# Patient Record
Sex: Female | Born: 1995 | Race: Black or African American | Hispanic: No | Marital: Single | Smoking: Never smoker
Health system: Southern US, Community
[De-identification: ages and names within clinical notes are randomized; demographics above are authoritative.]

## PROBLEM LIST (undated history)

## (undated) ENCOUNTER — Inpatient Hospital Stay (HOSPITAL_COMMUNITY): Payer: Self-pay

## (undated) DIAGNOSIS — R569 Unspecified convulsions: Secondary | ICD-10-CM

## (undated) DIAGNOSIS — N390 Urinary tract infection, site not specified: Secondary | ICD-10-CM

---
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
## 2014-10-07 ENCOUNTER — Encounter (HOSPITAL_COMMUNITY): Payer: Self-pay

## 2014-10-07 ENCOUNTER — Inpatient Hospital Stay (HOSPITAL_COMMUNITY): Payer: Self-pay

## 2014-10-07 ENCOUNTER — Inpatient Hospital Stay (HOSPITAL_COMMUNITY)
Admission: AD | Admit: 2014-10-07 | Discharge: 2014-10-07 | Disposition: A | Discharge status: Home / Self Care | Payer: Self-pay | Source: Ambulatory Visit | Attending: Obstetrics & Gynecology | Admitting: Obstetrics & Gynecology

## 2014-10-07 DIAGNOSIS — Z3A01 Less than 8 weeks gestation of pregnancy: Secondary | ICD-10-CM | POA: Insufficient documentation

## 2014-10-07 DIAGNOSIS — O23591 Infection of other part of genital tract in pregnancy, first trimester: Secondary | ICD-10-CM | POA: Insufficient documentation

## 2014-10-07 DIAGNOSIS — O26899 Other specified pregnancy related conditions, unspecified trimester: Secondary | ICD-10-CM

## 2014-10-07 DIAGNOSIS — R109 Unspecified abdominal pain: Secondary | ICD-10-CM

## 2014-10-07 DIAGNOSIS — O2341 Unspecified infection of urinary tract in pregnancy, first trimester: Secondary | ICD-10-CM | POA: Insufficient documentation

## 2014-10-07 DIAGNOSIS — O133 Gestational [pregnancy-induced] hypertension without significant proteinuria, third trimester: Secondary | ICD-10-CM

## 2014-10-07 DIAGNOSIS — B9689 Other specified bacterial agents as the cause of diseases classified elsewhere: Secondary | ICD-10-CM | POA: Insufficient documentation

## 2014-10-07 DIAGNOSIS — N760 Acute vaginitis: Secondary | ICD-10-CM | POA: Insufficient documentation

## 2014-10-07 HISTORY — DX: Unspecified convulsions: R56.9

## 2014-10-07 HISTORY — DX: Urinary tract infection, site not specified: N39.0

## 2014-10-07 LAB — URINALYSIS, ROUTINE W REFLEX MICROSCOPIC
Bilirubin Urine: NEGATIVE
Glucose, UA: NEGATIVE mg/dL
Ketones, ur: NEGATIVE mg/dL
LEUKOCYTES UA: NEGATIVE
Nitrite: POSITIVE — AB
Protein, ur: NEGATIVE mg/dL
SPECIFIC GRAVITY, URINE: 1.02 (ref 1.005–1.030)
UROBILINOGEN UA: 0.2 mg/dL (ref 0.0–1.0)
pH: 5.5 (ref 5.0–8.0)

## 2014-10-07 LAB — URINE MICROSCOPIC-ADD ON

## 2014-10-07 LAB — CBC
HEMATOCRIT: 34 % — AB (ref 36.0–46.0)
Hemoglobin: 11.8 g/dL — ABNORMAL LOW (ref 12.0–15.0)
MCH: 30.7 pg (ref 26.0–34.0)
MCHC: 34.7 g/dL (ref 30.0–36.0)
MCV: 88.5 fL (ref 78.0–100.0)
PLATELETS: 223 10*3/uL (ref 150–400)
RBC: 3.84 MIL/uL — ABNORMAL LOW (ref 3.87–5.11)
RDW: 11.7 % (ref 11.5–15.5)
WBC: 10.6 10*3/uL — AB (ref 4.0–10.5)

## 2014-10-07 LAB — WET PREP, GENITAL
Trich, Wet Prep: NONE SEEN
Yeast Wet Prep HPF POC: NONE SEEN

## 2014-10-07 LAB — HIV ANTIBODY (ROUTINE TESTING W REFLEX): HIV: NONREACTIVE

## 2014-10-07 LAB — HCG, QUANTITATIVE, PREGNANCY: hCG, Beta Chain, Quant, S: 19880 m[IU]/mL — ABNORMAL HIGH (ref <5)

## 2014-10-07 LAB — POCT PREGNANCY, URINE: PREG TEST UR: POSITIVE — AB

## 2014-10-07 MED ORDER — METRONIDAZOLE 500 MG PO TABS: 500.0000 mg | ORAL_TABLET | Freq: Two times a day (BID) | ORAL | Status: AC

## 2014-10-07 MED ORDER — CEPHALEXIN 500 MG PO CAPS: 500.0000 mg | ORAL_CAPSULE | Freq: Four times a day (QID) | ORAL | Status: AC

## 2014-10-07 MED ORDER — PRENATAL PLUS 27-1 MG PO TABS: 1.0000 | ORAL_TABLET | Freq: Every day | ORAL | Status: AC

## 2014-10-07 NOTE — Discharge Instructions (Signed)
Risks of Alcohol Use in Pregnancy Alcohol is the biggest cause of mental retardation in babies. Women who are pregnant or who may become pregnant should not drink alcohol. This will lessen the chance of giving birth to a baby with any of the harmful effects of FASD (Fetal Alcohol Spectrum Disorders). FASD is the full spectrum of birth defects caused by drinking while pregnant. The spectrum may include mild changes, such as a slight learning disability. Or it could be full FAS (Fetal Alcohol Syndrome). FAS is a permanent condition that can include: severe learning disabilities, decreased growth (growth deficiencies), abnormal facial features, brain (central nervous system) disorders, and behavior problems. It is unsafe to drink any amount of alcohol when pregnant. When the mother drinks alcohol, so does the baby. The amount of alcohol in the mother's blood shows up as the same amount found in the baby's blood. Alcohol is digested by the mature liver in the mother, but the liver is not mature in the baby. As a result, alcohol affects the baby much more than it affects the mother. The [employer]st[employer]ians and Gynecologists recommend the safest way to prevent problems to the baby is to not drink any amount of alcohol when pregnant. One of the most preventable causes of a baby developing FASD, especially FAS, is drinking alcohol. FAS is a lifelong physically and mentally disabling condition. The safest thing to do is not drink alcohol when pregnant. RISKS Alcohol consumed during pregnancy increases the risk of alcohol related birth defects. These include: Central nervous system disorders. Decreased growth. Delayed intellectual development. Behavioral disorders. Abnormal facial features. Mental retardation. Sleep and sucking disorders. Children with FAS are at risk of: Having psychiatric problems. Criminal behavior. Unemployment. Not finishing their education. No amount of alcohol can be  considered safe during pregnancy. Alcohol can damage a fetus at any stage of pregnancy. Damage can occur in the early weeks of pregnancy. This is even before a woman knows that she is pregnant. The learning (cognitive) and behavior problems due to prenatal alcohol exposure are lifelong. Alcohol-related birth defects are completely preventable. There is an increased risk of miscarriage and fetal death. Fetal growth retardation. Increase in accidents at home, away, and driving. Drinking alcohol can easily lead to abuse of drugs and smoking. SYMPTOMS  Drinking alcohol slows down the function of your body and brain. It negatively affects your: Walking (unstable). Hearing. Vision. Daily activities. Driving. Mind and memory. Talking (slurred speech). It contributes to bad diet and nutrition, which are important for the growth and development of the baby. In time, it will also cause problems with your health, your relationship with your family, friends, and work. DIAGNOSIS  Do not wait for symptoms to develop to make the diagnosis. At the first prenatal office visit, the caregiver and patient should discuss alcohol drinking when pregnant. If the mother is drinking alcohol, both the caregiver and patient should make a serious attempt to stop the drinking completely. TREATMENT  The best treatment is to not drink or stop drinking. If someone needs help to stop drinking, she should contact the local Alcoholics Anonymous chapter. Or contact a treatment center. The Substance Abuse and Mental Health Services [facility](SAMHSA) can help people find local centers in their area. Intervention by the spouse, family, and friends. Counseling by pastor, psychologist, or psychiatrist. Hospitalization may be necessary. If the spouse drinks alcohol, he should stop. HOME CARE INSTRUCTIONS  A woman should not drink alcohol while she is pregnant. A pregnant woman who has  already consumed alcohol during her pregnancy  should stop immediately. This will reduce further risk to the baby. A woman who is considering becoming pregnant should not drink alcohol. Women of child-bearing age should talk to their caregivers about this issue. Then take steps to reduce the chance of drinking alcohol while pregnant. The father plays an important role in helping the mother abstain from drinking alcohol. He can encourage her to abstain and should abstain himself. Avoiding social events where alcohol is served will help, too. If you think your baby has FASD or FAS, call your caregiver. FOR MORE INFORMATION: The [employer] on Alcohol Abuse and Alcoholism, NIH: BasicStudents.dk The Substance Abuse and Mental Health Services Administration: www.fascenter.RockToxic.pl Document Released: 08/07/2007 Document Revised: 01/02/2012 Document Reviewed: 12/12/2013 [facility] Patient Information 2015 [place], [place]. This information is not intended to replace advice given to you by your health care provider. Make sure you discuss any questions you have with your health care provider.  First Trimester of Pregnancy The first trimester of pregnancy is from week 1 until the end of week 12 (months 1 through 3). A week after a sperm fertilizes an egg, the egg will implant on the wall of the uterus. This embryo will begin to develop into a baby. Genes from you and your partner are forming the baby. The female genes determine whether the baby is a boy or a girl. At 6-8 weeks, the eyes and face are formed, and the heartbeat can be seen on ultrasound. At the end of 12 weeks, all the baby's organs are formed.  Now that you are pregnant, you will want to do everything you can to have a healthy baby. Two of the most important things are to get good prenatal care and to follow your health care provider's instructions. Prenatal care is all the medical care you receive before the baby's birth. This care will help prevent, find, and treat any problems during  the pregnancy and childbirth. BODY CHANGES Your body goes through many changes during pregnancy. The changes vary from woman to woman.   You may gain or lose a couple of pounds at first.  You may feel sick to your stomach (nauseous) and throw up (vomit). If the vomiting is uncontrollable, call your health care provider.  You may tire easily.  You may develop headaches that can be relieved by medicines approved by your health care provider.  You may urinate more often. Painful urination may mean you have a bladder infection.  You may develop heartburn as a result of your pregnancy.  You may develop constipation because certain hormones are causing the muscles that push waste through your intestines to slow down.  You may develop hemorrhoids or swollen, bulging veins (varicose veins).  Your breasts may begin to grow larger and become tender. Your nipples may stick out more, and the tissue that surrounds them (areola) may become darker.  Your gums may bleed and may be sensitive to brushing and flossing.  Dark spots or blotches (chloasma, mask of pregnancy) may develop on your face. This will likely fade after the baby is born.  Your menstrual periods will stop.  You may have a loss of appetite.  You may develop cravings for certain kinds of food.  You may have changes in your emotions from day to day, such as being excited to be pregnant or being concerned that something may go wrong with the pregnancy and baby.  You may have more vivid and strange dreams.  You may  have changes in your hair. These can include thickening of your hair, rapid growth, and changes in texture. Some women also have hair loss during or after pregnancy, or hair that feels dry or thin. Your hair will most likely return to normal after your baby is born. WHAT TO EXPECT AT YOUR PRENATAL VISITS During a routine prenatal visit:  You will be weighed to make sure you and the baby are growing normally.  Your  blood pressure will be taken.  Your abdomen will be measured to track your baby's growth.  The fetal heartbeat will be listened to starting around week 10 or 12 of your pregnancy.  Test results from any previous visits will be discussed. Your health care provider may ask you:  How you are feeling.  If you are feeling the baby move.  If you have had any abnormal symptoms, such as leaking fluid, bleeding, severe headaches, or abdominal cramping.  If you have any questions. Other tests that may be performed during your first trimester include:  Blood tests to find your blood type and to check for the presence of any previous infections. They will also be used to check for low iron levels (anemia) and Rh antibodies. Later in the pregnancy, blood tests for diabetes will be done along with other tests if problems develop.  Urine tests to check for infections, diabetes, or protein in the urine.  An ultrasound to confirm the proper growth and development of the baby.  An amniocentesis to check for possible genetic problems.  Fetal screens for spina bifida and Down syndrome.  You may need other tests to make sure you and the baby are doing well. HOME CARE INSTRUCTIONS  Medicines  Follow your health care provider's instructions regarding medicine use. Specific medicines may be either safe or unsafe to take during pregnancy.  Take your prenatal vitamins as directed.  If you develop constipation, try taking a stool softener if your health care provider approves. Diet  Eat regular, well-balanced meals. Choose a variety of foods, such as meat or vegetable-based protein, fish, milk and low-fat dairy products, vegetables, fruits, and whole grain breads and cereals. Your health care provider will help you determine the amount of weight gain that is right for you.  Avoid raw meat and uncooked cheese. These carry germs that can cause birth defects in the baby.  Eating four or five small meals  rather than three large meals a day may help relieve nausea and vomiting. If you start to feel nauseous, eating a few soda crackers can be helpful. Drinking liquids between meals instead of during meals also seems to help nausea and vomiting.  If you develop constipation, eat more high-fiber foods, such as fresh vegetables or fruit and whole grains. Drink enough fluids to keep your urine clear or pale yellow. Activity and Exercise  Exercise only as directed by your health care provider. Exercising will help you:  Control your weight.  Stay in shape.  Be prepared for labor and delivery.  Experiencing pain or cramping in the lower abdomen or low back is a good sign that you should stop exercising. Check with your health care provider before continuing normal exercises.  Try to avoid standing for long periods of time. Move your legs often if you must stand in one place for a long time.  Avoid heavy lifting.  Wear low-heeled shoes, and practice good posture.  You may continue to have sex unless your health care provider directs you otherwise. Relief of  Pain or Discomfort  Wear a good support bra for breast tenderness.   Take warm sitz baths to soothe any pain or discomfort caused by hemorrhoids. Use hemorrhoid cream if your health care provider approves.   Rest with your legs elevated if you have leg cramps or low back pain.  If you develop varicose veins in your legs, wear support hose. Elevate your feet for 15 minutes, 3-4 times a day. Limit salt in your diet. Prenatal Care  Schedule your prenatal visits by the twelfth week of pregnancy. They are usually scheduled monthly at first, then more often in the last 2 months before delivery.  Write down your questions. Take them to your prenatal visits.  Keep all your prenatal visits as directed by your health care provider. Safety  Wear your seat belt at all times when driving.  Make a list of emergency phone numbers, including  numbers for family, friends, the hospital, and police and fire departments. General Tips  Ask your health care provider for a referral to a local prenatal education class. Begin classes no later than at the beginning of month 6 of your pregnancy.  Ask for help if you have counseling or nutritional needs during pregnancy. Your health care provider can offer advice or refer you to specialists for help with various needs.  Do not use hot tubs, steam rooms, or saunas.  Do not douche or use tampons or scented sanitary pads.  Do not cross your legs for long periods of time.  Avoid cat litter boxes and soil used by cats. These carry germs that can cause birth defects in the baby and possibly loss of the fetus by miscarriage or stillbirth.  Avoid all smoking, herbs, alcohol, and medicines not prescribed by your health care provider. Chemicals in these affect the formation and growth of the baby.  Schedule a dentist appointment. At home, brush your teeth with a soft toothbrush and be gentle when you floss. SEEK MEDICAL CARE IF:   You have dizziness.  You have mild pelvic cramps, pelvic pressure, or nagging pain in the abdominal area.  You have persistent nausea, vomiting, or diarrhea.  You have a bad smelling vaginal discharge.  You have pain with urination.  You notice increased swelling in your face, hands, legs, or ankles. SEEK IMMEDIATE MEDICAL CARE IF:   You have a fever.  You are leaking fluid from your vagina.  You have spotting or bleeding from your vagina.  You have severe abdominal cramping or pain.  You have rapid weight gain or loss.  You vomit blood or material that looks like coffee grounds.  You are exposed to [place] measles and have never had them.  You are exposed to fifth disease or chickenpox.  You develop a severe headache.  You have shortness of breath.  You have any kind of trauma, such as from a fall or a car accident. Document Released: 10/04/2001  Document Revised: 02/24/2014 Document Reviewed: 08/20/2013 [facility] Patient Information 2015 [place], [place]. This information is not intended to replace advice given to you by your health care provider. Make sure you discuss any questions you have with your health care provider. Pregnancy and Urinary Tract Infection A urinary tract infection (UTI) is a bacterial infection of the urinary tract. Infection of the urinary tract can include the ureters, kidneys (pyelonephritis), bladder (cystitis), and urethra (urethritis). All pregnant women should be screened for bacteria in the urinary tract. Identifying and treating a UTI will decrease the risk of preterm labor  and developing more serious infections in both the mother and baby. CAUSES Bacteria germs cause almost all UTIs.  RISK FACTORS Many factors can increase your chances of getting a UTI during pregnancy. These include:  Having a short urethra.  Poor toilet and hygiene habits.  Sexual intercourse.  Blockage of urine along the urinary tract.  Problems with the pelvic muscles or nerves.  Diabetes.  Obesity.  Bladder problems after having several children.  Previous history of UTI. SIGNS AND SYMPTOMS   Pain, burning, or a stinging feeling when urinating.  Suddenly feeling the need to urinate right away (urgency).  Loss of bladder control (urinary incontinence).  Frequent urination, more than is common with pregnancy.  Lower abdominal or back discomfort.  Cloudy urine.  Blood in the urine (hematuria).  Fever. When the kidneys are infected, the symptoms may be:  Back pain.  Flank pain on the right side more so than the left.  Fever.  Chills.  Nausea.  Vomiting. DIAGNOSIS  A urinary tract infection is usually diagnosed through urine tests. Additional tests and procedures are sometimes done. These may include:  Ultrasound exam of the kidneys, ureters, bladder, and urethra.  Looking in the bladder with a  lighted tube (cystoscopy). TREATMENT Typically, UTIs can be treated with antibiotic medicines.  HOME CARE INSTRUCTIONS   Only take over-the-counter or prescription medicines as directed by your health care provider. If you were prescribed antibiotics, take them as directed. Finish them even if you start to feel better.  Drink enough fluids to keep your urine clear or pale yellow.  Do not have sexual intercourse until the infection is gone and your health care provider says it is okay.  Make sure you are tested for UTIs throughout your pregnancy. These infections often come back. Preventing a UTI in the Future  Practice good toilet habits. Always wipe from front to back. Use the tissue only once.  Do not hold your urine. Empty your bladder as soon as possible when the urge comes.  Do not douche or use deodorant sprays.  Wash with soap and warm water around the genital area and the anus.  Empty your bladder before and after sexual intercourse.  Wear underwear with a cotton crotch.  Avoid caffeine and carbonated drinks. They can irritate the bladder.  Drink cranberry juice or take cranberry pills. This may decrease the risk of getting a UTI.  Do not drink alcohol.  Keep all your appointments and tests as scheduled. SEEK MEDICAL CARE IF:   Your symptoms get worse.  You are still having fevers 2 or more days after treatment begins.  You have a rash.  You feel that you are having problems with medicines prescribed.  You have abnormal vaginal discharge. SEEK IMMEDIATE MEDICAL CARE IF:   You have back or flank pain.  You have chills.  You have blood in your urine.  You have nausea and vomiting.  You have contractions of your uterus.  You have a gush of fluid from the vagina. MAKE SURE YOU:  Understand these instructions.   Will watch your condition.   Will get help right away if you are not doing well or get worse.  Document Released: 02/04/2011 Document  Revised: 07/31/2013 Document Reviewed: 05/09/2013 [facility] Patient Information 2015 [place], [place]. This information is not intended to replace advice given to you by your health care provider. Make sure you discuss any questions you have with your health care provider. Bacterial Vaginosis Bacterial vaginosis is a vaginal infection  that occurs when the normal balance of bacteria in the vagina is disrupted. It results from an overgrowth of certain bacteria. This is the most common vaginal infection in women of childbearing age. Treatment is important to prevent complications, especially in pregnant women, as it can cause a premature delivery. CAUSES  Bacterial vaginosis is caused by an increase in harmful bacteria that are normally present in smaller amounts in the vagina. Several different kinds of bacteria can cause bacterial vaginosis. However, the reason that the condition develops is not fully understood. RISK FACTORS Certain activities or behaviors can put you at an increased risk of developing bacterial vaginosis, including:  Having a new sex partner or multiple sex partners.  Douching.  Using an intrauterine device (IUD) for contraception. Women do not get bacterial vaginosis from toilet seats, bedding, swimming pools, or contact with objects around them. SIGNS AND SYMPTOMS  Some women with bacterial vaginosis have no signs or symptoms. Common symptoms include:  Grey vaginal discharge.  A fishlike odor with discharge, especially after sexual intercourse.  Itching or burning of the vagina and vulva.  Burning or pain with urination. DIAGNOSIS  Your health care provider will take a medical history and examine the vagina for signs of bacterial vaginosis. A sample of vaginal fluid may be taken. Your health care provider will look at this sample under a microscope to check for bacteria and abnormal cells. A vaginal pH test may also be done.  TREATMENT  Bacterial vaginosis may be treated  with antibiotic medicines. These may be given in the form of a pill or a vaginal cream. A second round of antibiotics may be prescribed if the condition comes back after treatment.  HOME CARE INSTRUCTIONS   Only take over-the-counter or prescription medicines as directed by your health care provider.  If antibiotic medicine was prescribed, take it as directed. Make sure you finish it even if you start to feel better.  Do not have sex until treatment is completed.  Tell all sexual partners that you have a vaginal infection. They should see their health care provider and be treated if they have problems, such as a mild rash or itching.  Practice safe sex by using condoms and only having one sex partner. SEEK MEDICAL CARE IF:   Your symptoms are not improving after 3 days of treatment.  You have increased discharge or pain.  You have a fever. MAKE SURE YOU:   Understand these instructions.  Will watch your condition.  Will get help right away if you are not doing well or get worse. FOR MORE INFORMATION  Centers for Disease Control and Prevention, Division of STD Prevention: SolutionApps.co.za American Sexual Health Association (ASHA): www.ashastd.org  Document Released: 10/10/2005 Document Revised: 07/31/2013 Document Reviewed: 05/22/2013 [facility] Patient Information 2015 [place], [place]. This information is not intended to replace advice given to you by your health care provider. Make sure you discuss any questions you have with your health care provider.

## 2014-10-07 NOTE — MAU Provider Note (Signed)
History     CSN: [id number]  Arrival date and time: 10/07/14 1139   None    Abd pain No chief complaint on file.  HPI 18 y.o. Unknown gestational age presents with bil lower abd cramping pain x 2d with nausea and 1 episode of vomiting partially digested food 2d ago. Pt reports 2 positive home pregnancy tests. Denies fever/chills, vaginal discharge or bleeding, dysuria, hematuria.  Pt unable to report last menses, reports stopped OCP approx 45 days ago. Currently in monogamous relationship, 2 lifetime sexual partners. Denies hx std exposure or treatment, no previous pap/pelvic exams.    OB History    Gravida Para Term Preterm AB TAB SAB Ectopic Multiple Living   1         0      Past Medical History  Diagnosis Date  . UTI (lower urinary tract infection)   . 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 No pertinent past surgical history.  History reviewed. No pertinent family history.  History  Substance Use Topics  . Smoking status: Never Smoker   . Smokeless tobacco: Not on file  . Alcohol Use: No    Allergies: Not on File  No prescriptions prior to admission    Review of Systems  Constitutional: Negative for fever, chills, weight loss and malaise/fatigue.  Respiratory: Negative for shortness of breath.   Cardiovascular: Negative for chest pain.  Gastrointestinal: Positive for vomiting and abdominal pain. Negative for nausea, diarrhea and constipation.       Bil lower abd into pelvic pain  Genitourinary: Negative for dysuria, urgency, frequency and hematuria.  Neurological: Negative for weakness.   Physical Exam   Blood pressure 109/75, pulse 79, temperature 98.9 F (37.2 C), temperature source Oral, resp. rate 18.  Physical Exam  Constitutional: She is oriented to person, place, and time. She appears well-developed and well-nourished.  Cardiovascular: Normal rate and normal heart sounds.   Respiratory: Effort normal and  breath sounds normal.  GI: Soft. Bowel sounds are normal. She exhibits no distension and no mass. There is no tenderness. There is no rebound, no guarding, no CVA tenderness, no tenderness at McBurney's point and negative Murphy's sign.  Genitourinary: Vagina normal.  Scant white odorless thin vaginal discharge, no CMT. Cervix pink, no exudate inflammation or lesions, Os closed.  Neurological: She is alert and oriented to person, place, and time.    MAU Course  Procedures  MDM US R/O ectopic, labs eval possible STD or UTI as cause of abd pain.  Results for orders placed or performed during the hospital encounter of 10/07/14 (from the past 24 hour(s))  Urinalysis, Routine w reflex microscopic     Status: Abnormal   Collection Time: 10/07/14 12:07 PM  Result Value Ref Range   Color, Urine YELLOW YELLOW   APPearance HAZY (A) CLEAR   Specific Gravity, Urine 1.020 1.005 - 1.030   pH 5.5 5.0 - 8.0   Glucose, UA NEGATIVE NEGATIVE mg/dL   Hgb urine dipstick TRACE (A) NEGATIVE   Bilirubin Urine NEGATIVE NEGATIVE   Ketones, ur NEGATIVE NEGATIVE mg/dL   Protein, ur NEGATIVE NEGATIVE mg/dL   Urobilinogen, UA 0.2 0.0 - 1.0 mg/dL   Nitrite POSITIVE (A) NEGATIVE   Leukocytes, UA NEGATIVE NEGATIVE  Urine microscopic-add on     Status: Abnormal   Collection Time: 10/07/14 12:07 PM  Result Value Ref Range   Squamous Epithelial / LPF FEW (A) RARE  WBC, UA 0-2 <3 WBC/hpf   Bacteria, UA FEW (A) RARE  Pregnancy, urine POC     Status: Abnormal   Collection Time: 10/07/14 12:11 PM  Result Value Ref Range   Preg Test, Ur POSITIVE (A) NEGATIVE  CBC     Status: Abnormal   Collection Time: 10/07/14  2:20 PM  Result Value Ref Range   WBC 10.6 (H) 4.0 - 10.5 K/uL   RBC 3.84 (L) 3.87 - 5.11 MIL/uL   Hemoglobin 11.8 (L) 12.0 - 15.0 g/dL   HCT 16.1 (L) 09.6 - 04.5 %   MCV 88.5 78.0 - 100.0 fL   MCH 30.7 26.0 - 34.0 pg   MCHC 34.7 30.0 - 36.0 g/dL   RDW 40.9 81.1 - 91.4 %   Platelets 223 150 - 400  K/uL  hCG, quantitative, pregnancy     Status: Abnormal   Collection Time: 10/07/14  2:20 PM  Result Value Ref Range   hCG, Beta Chain, Quant, S 19880 (H) <5 mIU/mL  Wet prep, genital     Status: Abnormal   Collection Time: 10/07/14  2:39 PM  Result Value Ref Range   Yeast Wet Prep HPF POC NONE SEEN NONE SEEN   Trich, Wet Prep NONE SEEN NONE SEEN   Clue Cells Wet Prep HPF POC FEW (A) NONE SEEN   WBC, Wet Prep HPF POC FEW (A) NONE SEEN   US Ob Comp Less 14 Wks  10/07/2014   CLINICAL DATA:  Abdominal pain in pregnancy.  Unsure of LMP.  EXAM: OBSTETRIC <14 WK [place] AND TRANSVAGINAL OB US  TECHNIQUE: Both transabdominal and transvaginal ultrasound examinations were performed for complete evaluation of the gestation as well as the maternal uterus, adnexal regions, and pelvic cul-de-sac. Transvaginal technique was performed to assess early pregnancy.  COMPARISON:  None.  FINDINGS: Intrauterine gestational sac: Visualized/normal in shape.  Yolk sac:  Visualized  Embryo:  Visualized  Cardiac Activity: Visualized  Heart Rate:  118 bpm  CRL:   8  mm   6 w 6 d                  [place] EDC: 05/27/2015  Maternal uterus/adnexae: A tiny subchorionic hemorrhage is noted. Both ovaries are normal in appearance. No adnexal mass or free fluid visualized.  IMPRESSION: Single living IUP measuring 6 weeks 6 days with [place] EDC of 05/27/2015.  Tiny subchorionic hemorrhage noted. No other significant abnormality identified.   Electronically Signed   By: Myles Rosenthal M.D.   On: 10/07/2014 15:38   US Ob Transvaginal  10/07/2014   CLINICAL DATA:  Abdominal pain in pregnancy.  Unsure of LMP.  EXAM: OBSTETRIC <14 WK [place] AND TRANSVAGINAL OB US  TECHNIQUE: Both transabdominal and transvaginal ultrasound examinations were performed for complete evaluation of the gestation as well as the maternal uterus, adnexal regions, and pelvic cul-de-sac. Transvaginal technique was performed to assess early pregnancy.  COMPARISON:  None.  FINDINGS:  Intrauterine gestational sac: Visualized/normal in shape.  Yolk sac:  Visualized  Embryo:  Visualized  Cardiac Activity: Visualized  Heart Rate:  118 bpm  CRL:   8  mm   6 w 6 d                  [place] EDC: 05/27/2015  Maternal uterus/adnexae: A tiny subchorionic hemorrhage is noted. Both ovaries are normal in appearance. No adnexal mass or free fluid visualized.  IMPRESSION: Single living IUP measuring 6 weeks 6 days with [place] [facility]  of 05/27/2015.  Tiny subchorionic hemorrhage noted. No other significant abnormality identified.   Electronically Signed   By: Myles RosenthalJohn  Stahl M.D.   On: 10/07/2014 15:38    Assessment and Plan  Discussed US results confirming IUP approx 6063w6d. Discussed lab results revealing BV and UTI, pt treated with ABX. Educated patient to cease all alcohol intake, provided first trimester expectations education. Pt to f/u and establish OBGYN.   Wynelle BourgeoisWILLIAMS,MARIE 10/07/2014, 12:32 PM

## 2014-10-08 LAB — GC/CHLAMYDIA PROBE AMP
CT Probe RNA: POSITIVE — AB
GC PROBE AMP APTIMA: NEGATIVE

## 2014-10-09 ENCOUNTER — Encounter (HOSPITAL_COMMUNITY): Payer: Self-pay | Admitting: *Deleted

## 2014-10-20 ENCOUNTER — Telehealth: Payer: Self-pay | Admitting: General Practice

## 2014-10-20 DIAGNOSIS — O98811 Other maternal infectious and parasitic diseases complicating pregnancy, first trimester: Principal | ICD-10-CM

## 2014-10-20 DIAGNOSIS — A749 Chlamydial infection, unspecified: Secondary | ICD-10-CM

## 2014-10-20 MED ORDER — AZITHROMYCIN 250 MG PO TABS: 1000.0000 mg | ORAL_TABLET | Freq: Once | ORAL | Status: AC

## 2014-10-20 NOTE — Telephone Encounter (Signed)
-----   Message from Glenda BanterMarni W Smith sent at 10/20/2014 10:54 AM EST ----- Patient returned call, notified her of positive chlamydia culture.  Patient has not been treated and will need Rx called in per protocol to [facility] [address].  Instructed patient to notify her partner for treatment.

## 2014-10-20 NOTE — Telephone Encounter (Signed)
Med ordered. Called patient, no answer- left message stating we are calling to info her of a prescription sent to her [facility], please go by and pick it up. If she has any questions she may call us back at the clinics

## 2015-08-01 IMAGING — US US OB COMP LESS 14 WK
1 series · 14 of 28 positions shown · non-contrast
Comparison: None.

CLINICAL DATA: Abdominal pain in pregnancy.  Unsure of LMP.

EXAM:
OBSTETRIC <14 WK US AND TRANSVAGINAL OB US
TECHNIQUE: Both transabdominal and transvaginal ultrasound examinations were
performed for complete evaluation of the gestation as well as the
maternal uterus, adnexal regions, and pelvic cul-de-sac.
Transvaginal technique was performed to assess early pregnancy.

[Series 1: us ob comp less 14 wks · 40 acquisitions, 14 frames shown]
[im 2/40]
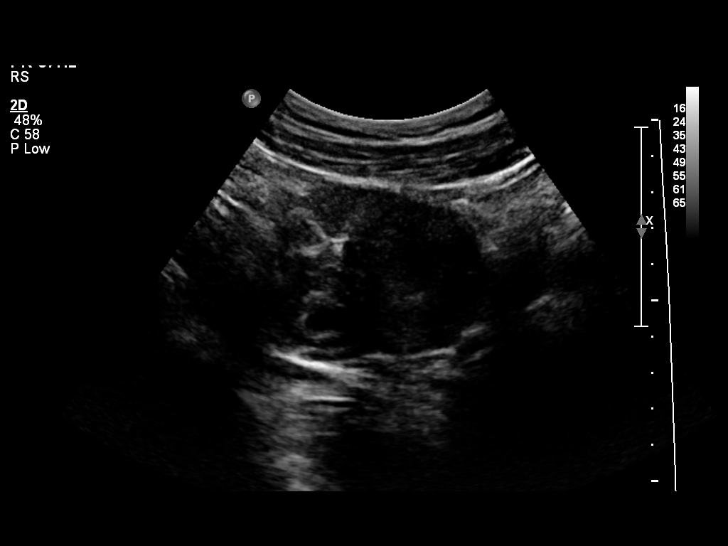
[im 5/40]
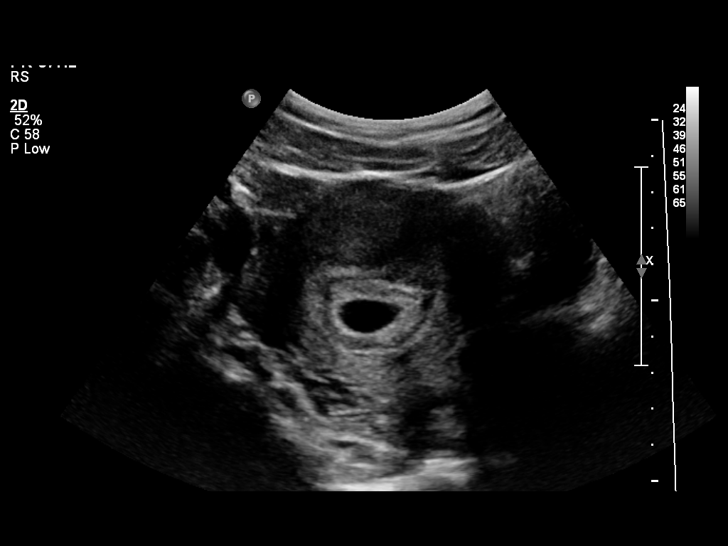
[im 8/40]
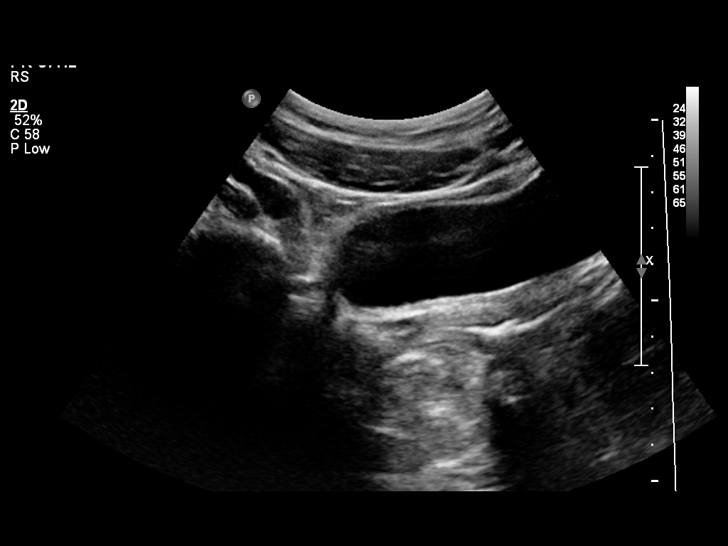
[im 11/40]
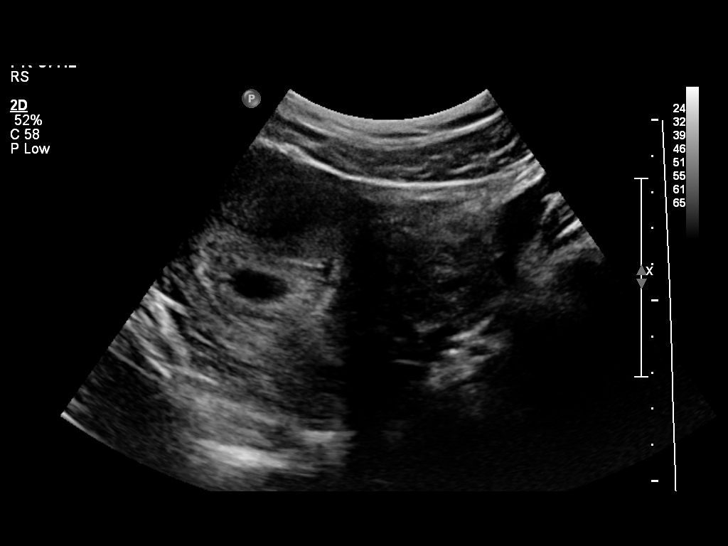
[im 14/40]
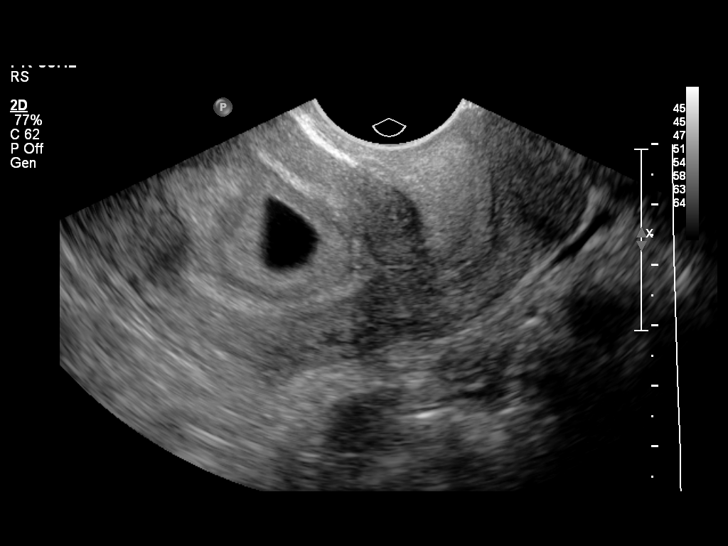
[im 16/40]
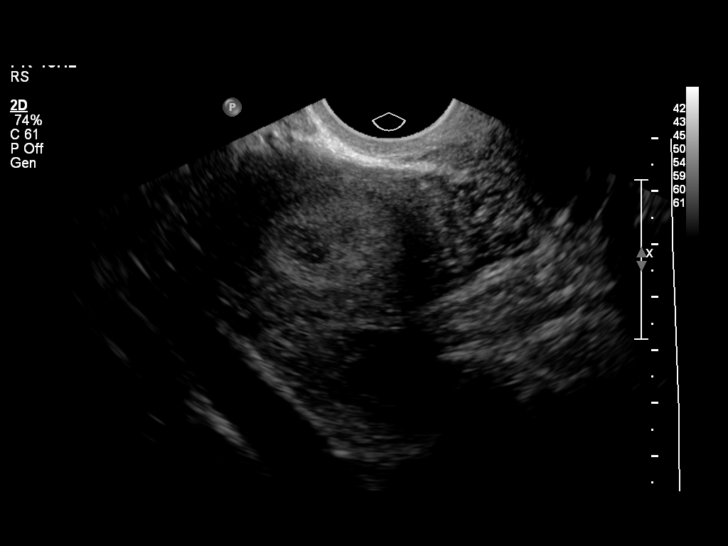
[im 19/40]
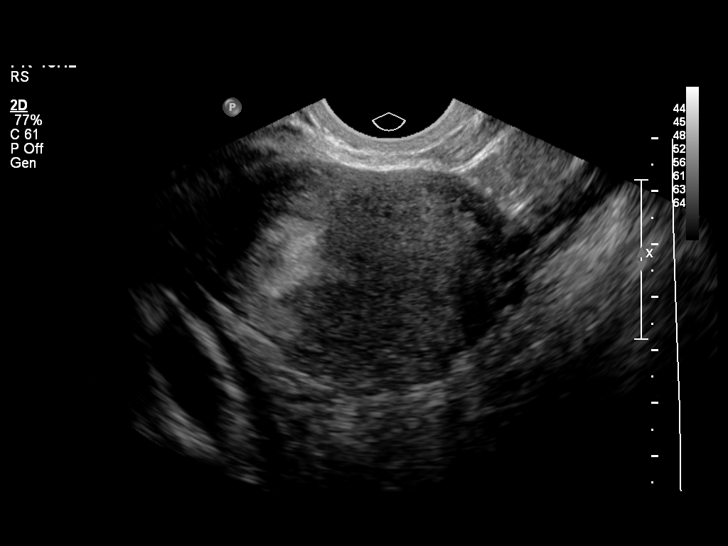
[im 22/40]
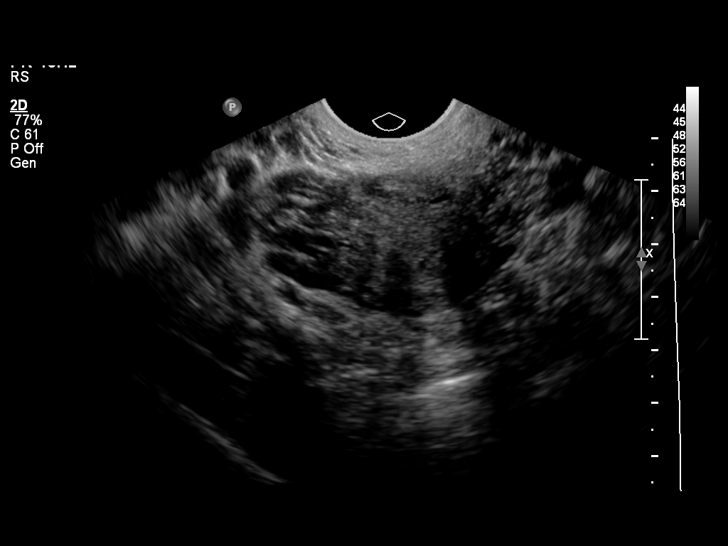
[im 25/40]
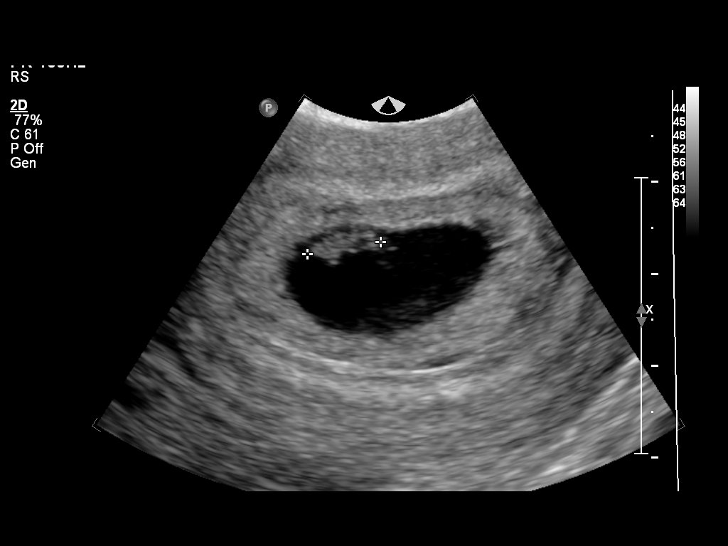
[im 28/40]
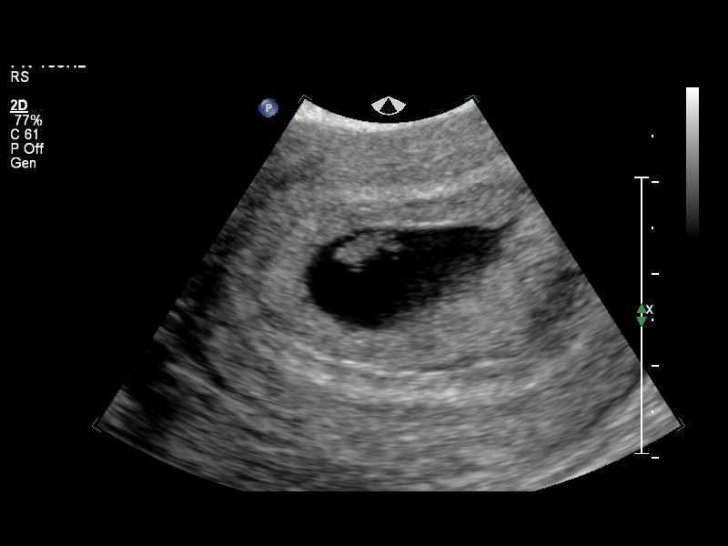
[im 31/40]
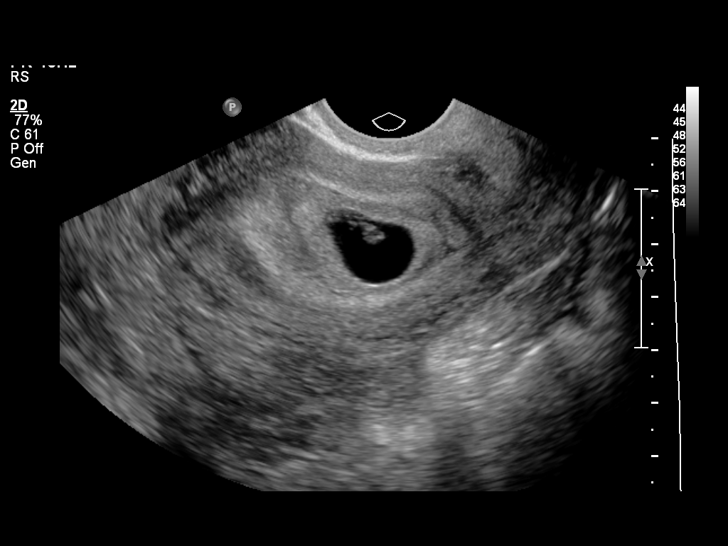
[im 34/40]
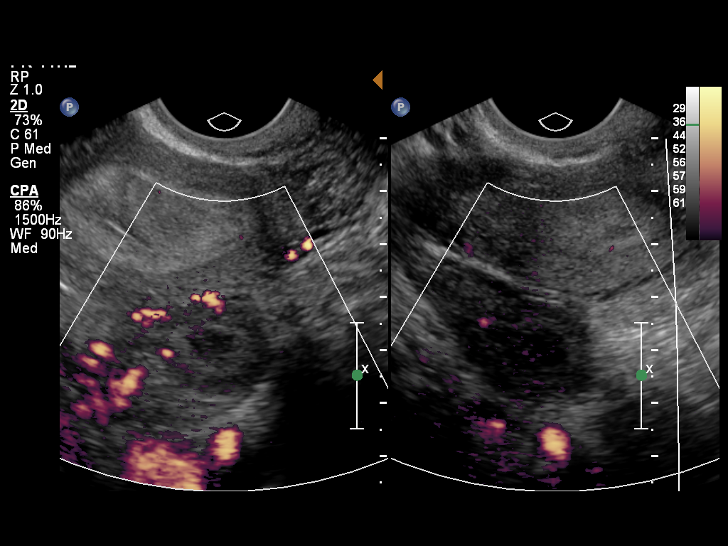
[im 37/40]
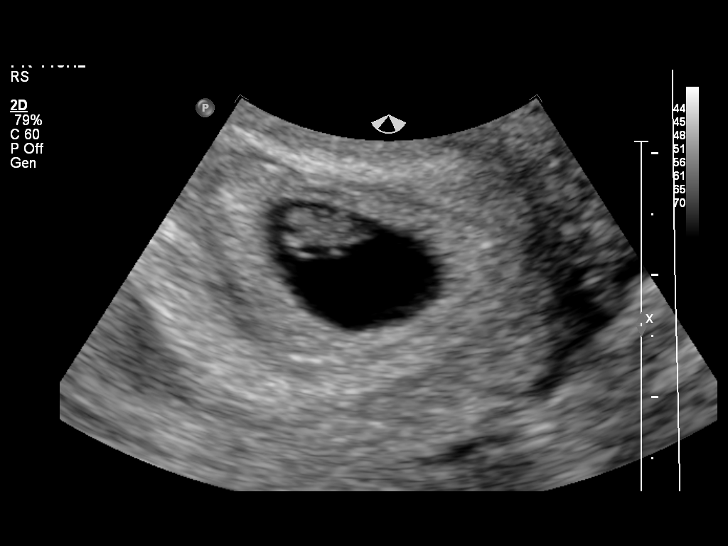
[im 40/40]
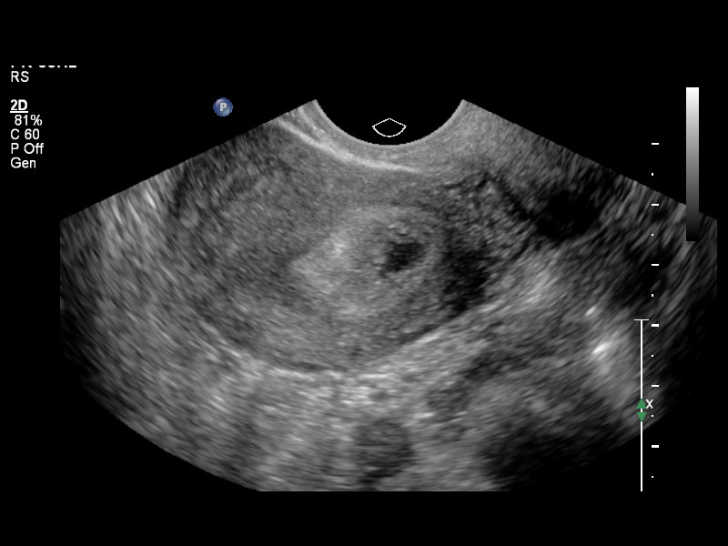

[14 of 28 positions shown; findings below may reference images not displayed]

FINDINGS: Intrauterine gestational sac: Visualized/normal in shape.

Yolk sac:  Visualized

Embryo:  Visualized

Cardiac Activity: Visualized

Heart Rate:  118 bpm

CRL:   8  mm   6 w 6 d                  US EDC: 05/27/2015

Maternal uterus/adnexae: A tiny subchorionic hemorrhage is noted.
Both ovaries are normal in appearance. No adnexal mass or free fluid
visualized.
IMPRESSION: Single living IUP measuring 6 weeks 6 days with US EDC of
05/27/2015.

Tiny subchorionic hemorrhage noted. No other significant abnormality
identified.

## 2015-08-12 ENCOUNTER — Encounter (HOSPITAL_COMMUNITY): Payer: Self-pay | Admitting: *Deleted
